# Patient Record
Sex: Male | Born: 1990 | Race: White
Health system: Southern US, Community
[De-identification: ages and names within clinical notes are randomized; demographics above are authoritative.]

---
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
## 2010-01-30 ENCOUNTER — Ambulatory Visit
Admit: 2010-01-30 | Disposition: A | Discharge status: Home / Self Care | Payer: Self-pay | Source: Ambulatory Visit | Admitting: Physical Medicine & Rehabilitation

## 2010-08-27 ENCOUNTER — Emergency Department: Payer: Self-pay | Admitting: Emergency Medicine

## 2010-10-07 IMAGING — CR FACIAL BONES - 1-2 VIEW
1 series · 4 of 4 positions shown · non-contrast
Comparison: none

REASON FOR EXAM: blunt object injury to left eye, need film of left
orbital area to rule out fx
COMMENTS:

PROCEDURE:     DXR - DXR FACIAL BONES LIMITED  - August 27, 2010  [DATE]
RESULT:     Facial bone images demonstrate slight difference in density
likely secondary to edema in the left periorbital region. A definite
fracture is not identified. The sinuses show grossly normal aeration.

[Series 1: view not recorded · 0.17mm/px · 4 of 4 slices shown]
[im 1/4]
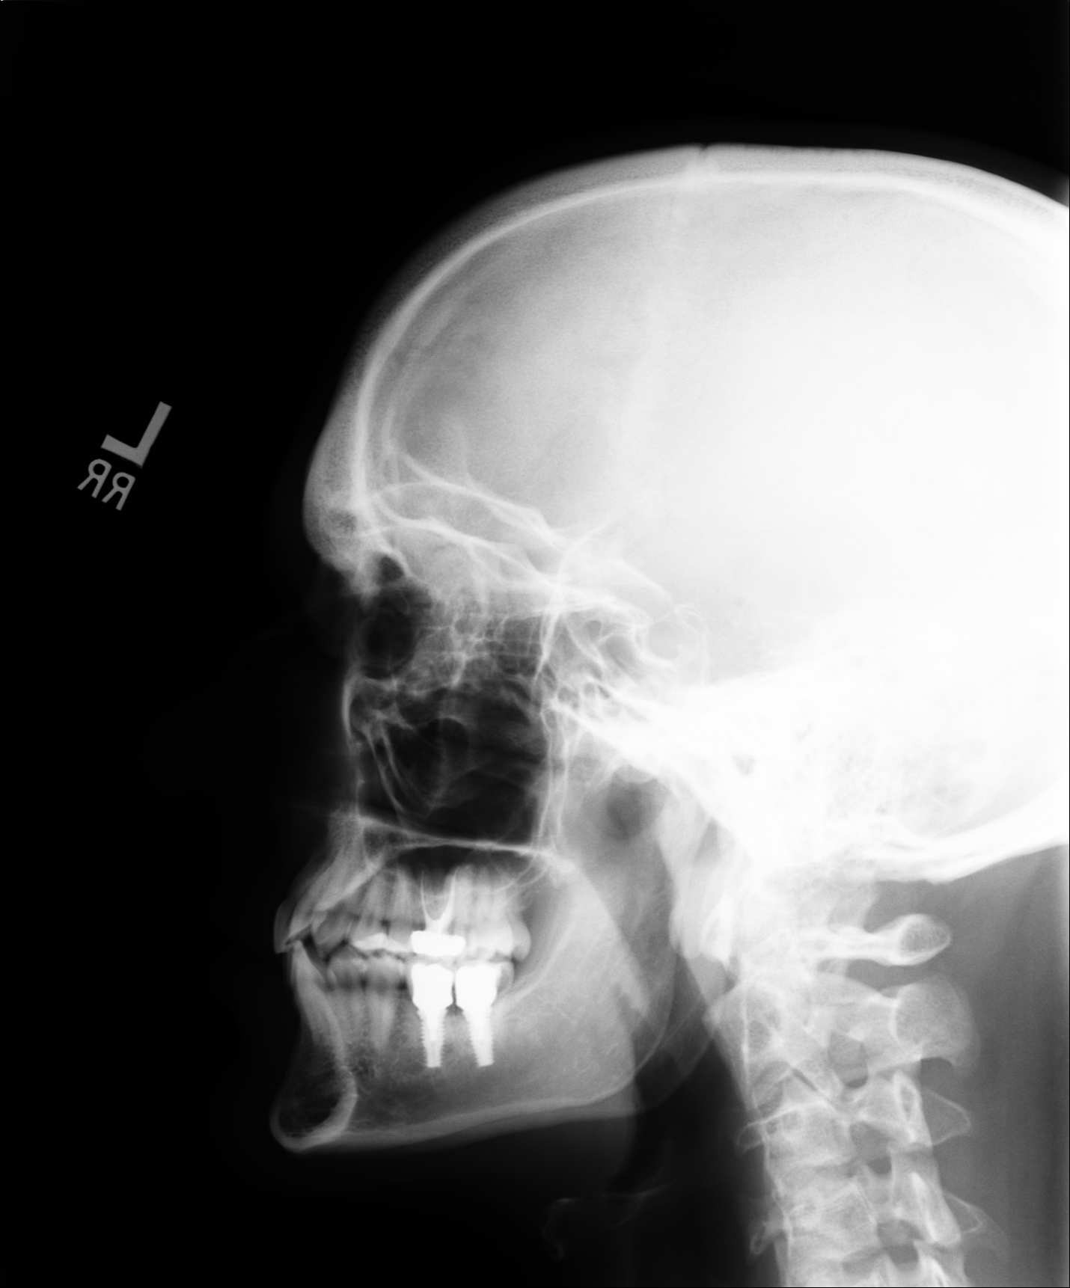
[im 2/4]
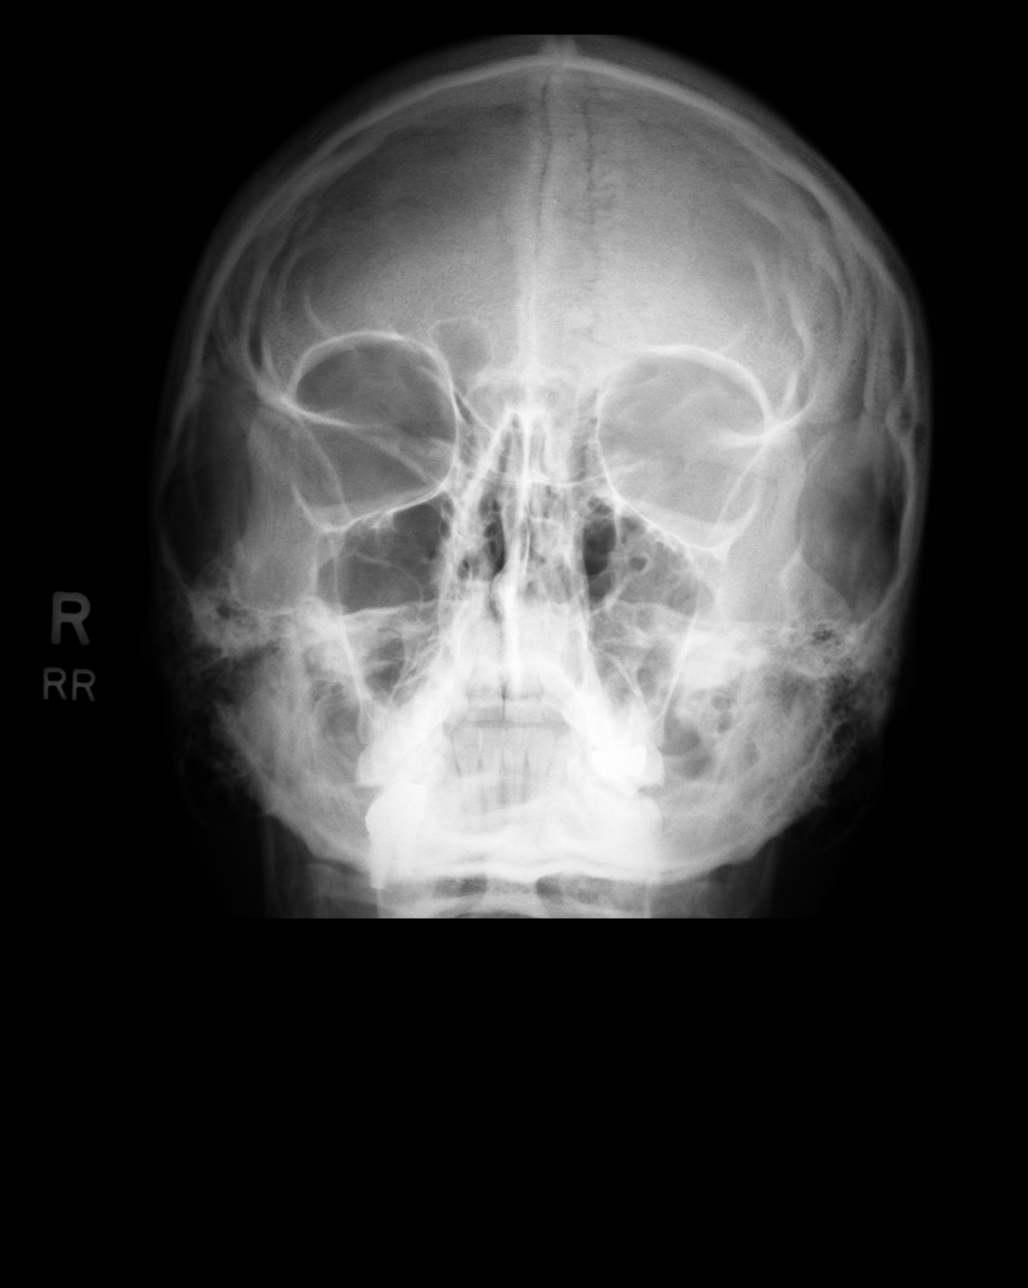
[im 3/4]
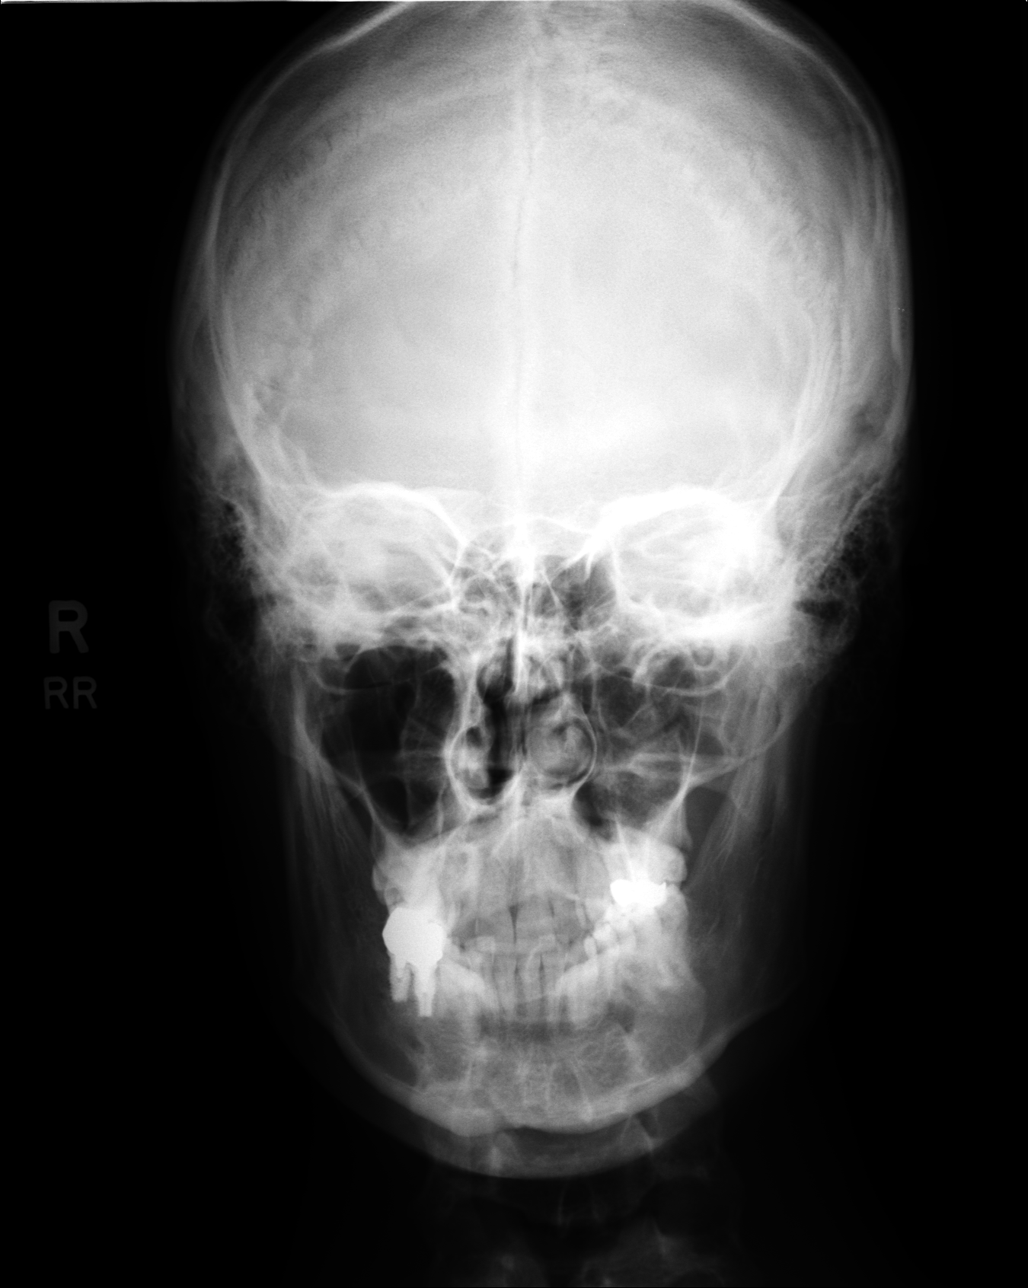
[im 4/4]
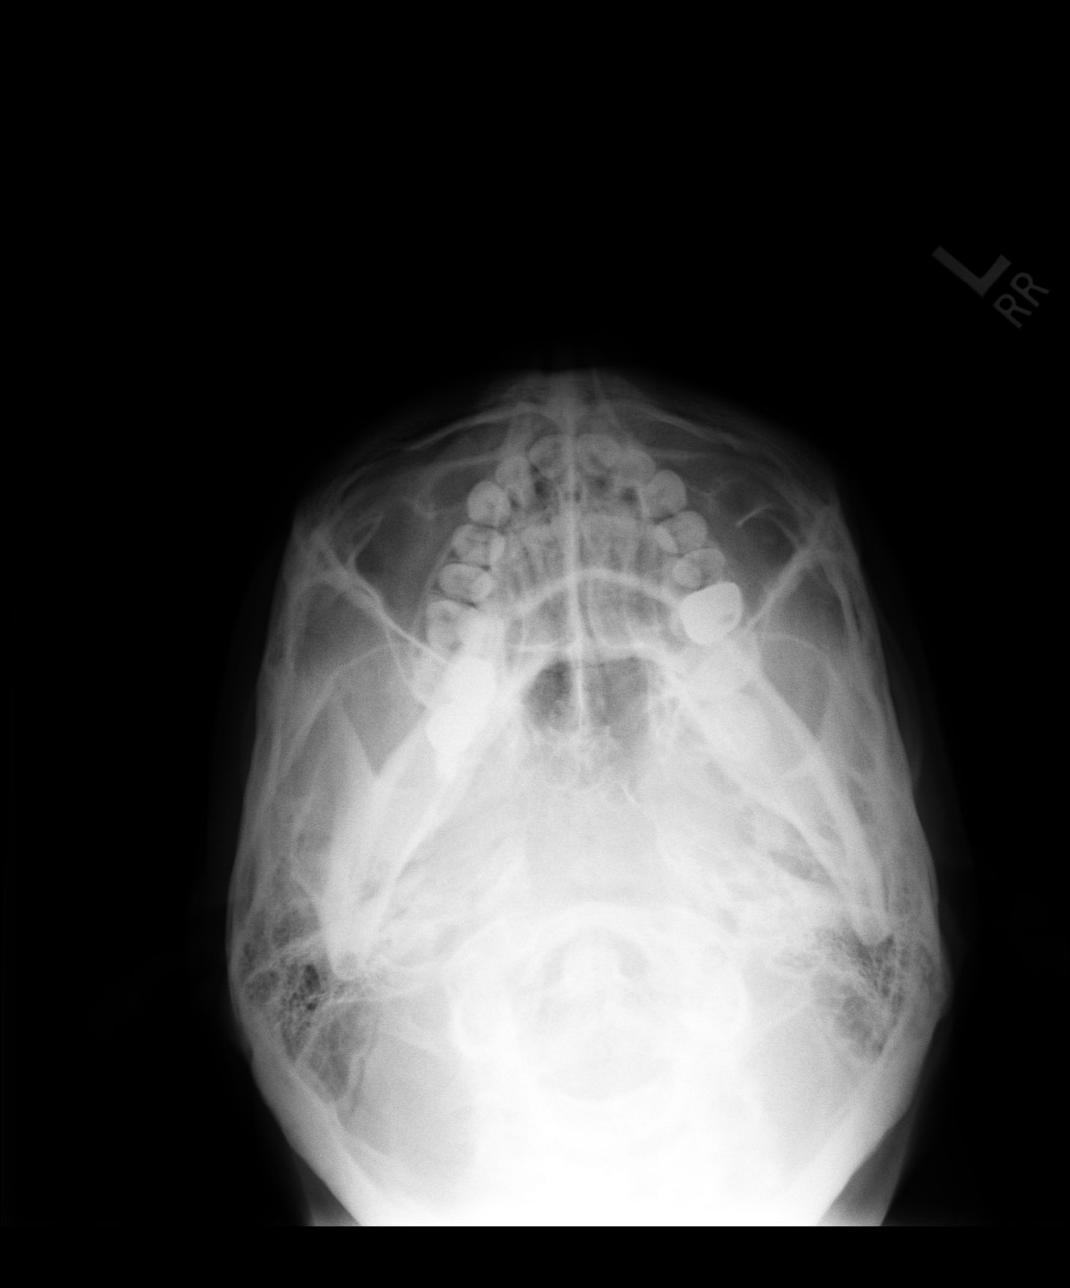

[4 of 4 positions shown; findings below may reference images not displayed]

IMPRESSION: No acute bony abnormality appreciated. CT is available, if
desired.

## 2011-10-11 NOTE — Progress Notes (Signed)
DATE OF BIRTH:                        [birth year]            DATE OF SERVICE:                      09/03/2007            PHYSICIAN:                          Lucrezia Europe, MD            HISTORY OF PRESENT ILLNESS: The patient returns for examination of his      right upper extremity. He has no complaints.            PHYSICAL EXAMINATION: Shows full range of motion.            IMAGING STUDIES: Radiograph shows the fracture is completely healed.            ASSESSMENT AND PLAN: Patient can begin performing high level activities      including lacrosse. He will follow up with me p.r.n.                                                Electronic Signing MD: Lucrezia Europe, MD  ([id number])            D:     09/03/2007 by Lucrezia Europe, MD      T:     09/03/2007 by [id number] Shela Commons:*********) ([id number])      cc:

## 2011-10-11 NOTE — Progress Notes (Signed)
DATE OF BIRTH:                        1991/05/01            DATE OF SERVICE:                      08/06/2007            PHYSICIAN:                          Lucrezia Europe, MD            REASON FOR VISIT:  The patient is now 6 weeks status post operative      fixation of a right proximal humerus fracture.  He has no complaints.            PHYSICAL EXAMINATION:  Examination shows near complete and full range of      motion.  He has no pain with  passive range of motion.            IMAGING STUDIES:  Radiograph shows excellent alignment and early callus      formation.            ASSESSMENT AND PLAN:  The patient will continue working with his physical      therapist.  He will now incorporate strengthening.  He will follow up in 4      weeks for repeat clinical and radiographic examination.                                                Electronic Signing MD: Lucrezia Europe, MD  ([id number])            D:     08/06/2007 by Lucrezia Europe, MD      T:     08/06/2007 by [id number] ([id number]) ([id number])      cc:

## 2011-10-11 NOTE — Progress Notes (Signed)
DATE OF BIRTH:                        [birth year]            DATE OF SERVICE:                      07/02/2007            PHYSICIAN:                          Lucrezia Europe, MD            The patient is 2 weeks status post operative internal fixation of a right      proximal humerus fracture that he sustained while playing lacrosse.            We removed his staples.  His wound is clean and dry.  His shoulder range of      motion is limited to 90 degrees forward flexion, 80 degrees abduction.            Radiograph shows excellent alignment.            ASSESSMENT AND PLAN: The patient will work on his range of motion both      actively and passively.  Follow up in 4 weeks for repeat clinical and      radiographic examination.                                                Electronic Signing MD: Lucrezia Europe, MD  ([id number])            D:     07/02/2007 by Lucrezia Europe, MD      T:     07/02/2007 by [id number] ([id number]) ([id number])      cc:

## 2011-10-11 NOTE — Op Note (Signed)
DATE OF BIRTH:                        [birth year]      ADMISSION DATE:                     06/23/2007            PATIENT LOCATION:                    [facility] 06/25/2007            DATE OF PROCEDURE:                   06/24/2007      SURGEON:                            Lucrezia Europe, MD      ASSISTANT(S):                  ASSISTANT:  Cipriano Mile            PREOPERATIVE DIAGNOSIS:  RIGHT PROXIMAL HUMERUS FRACTURE, SALTER-HARRIS      TYPE 2.            POSTOPERATIVE DIAGNOSIS:  RIGHT PROXIMAL HUMERUS FRACTURE, SALTER-HARRIS      TYPE 2.            PROCEDURE:  OPEN TREATMENT AND INTERNAL FIXATION OF RIGHT PROXIMAL HUMERUS      FRACTURE.            ANESTHESIA:  General.            COMPLICATIONS:  None.            ESTIMATED BLOOD LOSS:  Minimal.            INDICATIONS:  The patient is a 20 year old young man who sustained a      fracture to his right proximal humerus growth plate while playing Lacrosse.      The fracture was displaced 100%.  It was discussed with the patient and his      parents the indications for operative fixation and the risks, benefits, and      alternatives to operative treatment, versus closed treatment versus closed      reduction with percutaneous pinning.  They understood these and agreed to      proceed.            DESCRIPTION OF PROCEDURE:  After anesthesia was achieved, the right upper      extremity was prepped and draped in a standard sterile surgical fashion.            A deltopectoral approach was performed.  The incision was taken down      through skin and subcutaneous tissue.  The cephalic vein was mobilized with      the deltoid and the deltopectoral interval was entered.  Dissection was      taken down to the shaft of the humerus and the deltoid muscle was retracted      laterally.  The deltoid muscle was elevated distally.  The subscapularis      and pectoralis tendons were not taken down.            With traction and a levering maneuver, a reduction was performed.      Periosteum was  dissected on the proximal side of the fracture. On the  distal side, auto-dissection was performed.  The fracture was fine-tuned      and reduced anatomically which was confirmed under direct visualization and      fluoroscopic visualization.            Percutaneous 2.0 K-wires were placed in a retrograde fashion giving      temporary fixation.  An anterolateral locking plate was then placed.      Locking screws were placed into the metaphysis and head.  Nonlocking screws      were placed distally.  The percutaneous wires were removed and the limb was      taken through a full range of motion.  Multiple radiographs were obtained      in multiple planes demonstrating that all screws were safe.  The wounds      were then thoroughly irrigated.  The guide from the plate was removed.  The      clavipectoral fascia was closed with a 0 Prolene suture.  Subcutaneous      layer was closed with 2-0 Prolene and the skin was stapled.  Sterile      dressings were placed.  The patient was extubated and taken to the post      anesthesia care unit.                                          Electronic Signing MD: Lucrezia Europe, MD  ([id number])            D: 06/25/2007 by Lucrezia Europe, MD      T: 06/25/2007 by [email or web address] ([id number]) ([id number])      cc:  Lucrezia Europe, MD

## 2012-03-18 ENCOUNTER — Encounter (INDEPENDENT_AMBULATORY_CARE_PROVIDER_SITE_OTHER): Payer: Self-pay

## 2012-03-18 ENCOUNTER — Ambulatory Visit (INDEPENDENT_AMBULATORY_CARE_PROVIDER_SITE_OTHER): Payer: BC Managed Care – PPO | Admitting: Adult Health

## 2012-03-18 DIAGNOSIS — A64 Unspecified sexually transmitted disease: Secondary | ICD-10-CM

## 2012-03-20 NOTE — Progress Notes (Signed)
Left w/o being seen

## 2015-11-14 ENCOUNTER — Ambulatory Visit (INDEPENDENT_AMBULATORY_CARE_PROVIDER_SITE_OTHER): Payer: BLUE CROSS/BLUE SHIELD | Admitting: Sports Medicine

## 2015-11-14 ENCOUNTER — Other Ambulatory Visit: Payer: Self-pay | Admitting: Sports Medicine

## 2015-11-14 ENCOUNTER — Encounter: Payer: Self-pay | Admitting: Sports Medicine

## 2015-11-14 VITALS — BP 99/57 | HR 42 | Ht 75.0 in | Wt 190.0 lb

## 2015-11-14 DIAGNOSIS — M5442 Lumbago with sciatica, left side: Secondary | ICD-10-CM

## 2015-11-14 NOTE — Progress Notes (Signed)
   Subjective:    Patient ID: Justin Burns, male    DOB: Apr 25, 1991, 24 y.o.   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 Patient has had problems with his low back all the way back into childhood. He has had x-rays as well as a bone scan all of which are normal per his report. Over the summer he took a long road trip and did quite a bit of hiking and running and he began to experience some intermittent radiating pain down his left leg into the lateral aspect of his left lower leg. He has associated numbness and tingling with this as well. He has not noticed any weakness. He has been working in physical therapy for the past several weeks and has noticed some improvement with piriformis stretching but not complete symptom resolution. He denies any groin pain. His symptoms are most pronounced with prolonged driving or sitting. He does also get pain with prolonged standing. Significant discomfort with running as well. He has tried over-the-counter pain medications without any relief. No prior low back surgery. No change in bowel or bladder. He is here today at the request of his physical therapist for further evaluation and treatment.  Past medical history reviewed Medications reviewed Allergies reviewed    Review of Systems As above    Objective:   Physical Exam Well-developed, well-nourished. No acute distress. Awake alert and oriented 3. Vital signs reviewed.  Lumbar spine: Good lumbar range of motion. No tenderness to palpation along the lumbar midline. No spasm. Negative straight leg raise bilaterally. Strength is 5/5 both lower extremities. Reflexes are brisk and equal the Achilles and patellar tendons. Sensation is intact to light touch grossly. No obvious atrophy. Walking without a limp.       Assessment & Plan:  Chronic low back pain-rule out lumbar disc herniation versus spinal  stenosis  Patient's symptoms have been present intermittently for several years. He has had normal x-rays in the past. I think we need to completely evaluate this with an MRI specifically to rule out spinal stenosis or lumbar disc herniation. Patient will follow-up with me after that study to go over the results and delineate further treatment. He is instructed to bring his running shoes with him to his follow-up visit so that I can do a running analysis. I would also like to check some blood work as he is also complaining of some diffuse arthralgias. I will check a CBC, CMP, sedimentation rate, C-reactive protein, ANA, and rheumatoid factor.

## 2015-11-25 ENCOUNTER — Ambulatory Visit
Admission: RE | Admit: 2015-11-25 | Discharge: 2015-11-25 | Disposition: A | Discharge status: Home / Self Care | Payer: BLUE CROSS/BLUE SHIELD | Source: Ambulatory Visit | Attending: Sports Medicine | Admitting: Sports Medicine

## 2015-11-25 DIAGNOSIS — M5442 Lumbago with sciatica, left side: Secondary | ICD-10-CM

## 2015-11-30 ENCOUNTER — Ambulatory Visit (INDEPENDENT_AMBULATORY_CARE_PROVIDER_SITE_OTHER): Payer: BLUE CROSS/BLUE SHIELD | Admitting: Sports Medicine

## 2015-11-30 ENCOUNTER — Encounter: Payer: Self-pay | Admitting: Sports Medicine

## 2015-11-30 VITALS — BP 110/70 | Ht 75.0 in | Wt 190.0 lb

## 2015-11-30 DIAGNOSIS — M5442 Lumbago with sciatica, left side: Secondary | ICD-10-CM

## 2015-11-30 NOTE — Progress Notes (Signed)
   Subjective:    Patient ID: Justin Burns, male    DOB: 06-23-1991, 24 y.o.   MRN: [id number]  HPI   Patient comes in today for follow-up. MRI of his lumbar spine shows only a very minimal disc bulge at L4-L5 L5-S1. This is in stark contrast to what a chiropractor recently told him. He has not been doing any running over the past few weeks and as a result his symptoms have improved. He did not get the blood work done that was ordered during his last office visit. He also failed to bring his running shoes with him today.    Review of Systems     Objective:   Physical Exam Well-developed, well-nourished. No acute distress  Good lumbar range of motion. No gross focal neurological deficits of either lower extremity  MRI is as above       Assessment & Plan:  Chronic low back pain likely biomechanical in nature  Reassurance regarding his MRI. I still think that his symptoms are biomechanical and I would like to do a running analysis. Unfortunately he forgot to bring his running shoes with him. He will call for a follow-up appointment sometime early next year and we will plan on doing a gait analysis and running analysis at that time. We will also reorder his blood work at follow-up as well. In the meantime, I think he may continue with activity as tolerated.

## 2015-12-15 DIAGNOSIS — F419 Anxiety disorder, unspecified: Secondary | ICD-10-CM | POA: Insufficient documentation

## 2015-12-28 ENCOUNTER — Telehealth: Payer: Self-pay | Admitting: Sports Medicine

## 2015-12-28 NOTE — Telephone Encounter (Signed)
I spoke with the patient on the phone today about his ongoing low back pain. Since seeing me, he has sought a second opinion. He was referred to [facility] for physical therapy and he called me to see if I agree with this plan of care. I reassured him that I think this is the best plan of care for his low back pain. His MRI is fairly unremarkable. He certainly does not have any surgical pathology. I've encouraged him to continue to be active and I've reassured him that he is not going to do any structural damage to his spine by doing so. I will fax over a referral to [facility] and [facility] and he will wean to a home exercise program per their discretion. I think it would be beneficial for him to also follow-up with me at some point in the near future for a gait analysis to see if there is something that we may need to do within orthotic.

## 2015-12-28 NOTE — Telephone Encounter (Signed)
-----   Message from Claiborne BillingsMartha J Delaney sent at 12/27/2015  4:46 PM EST ----- Contact: [phone] Would like a call regarding / follow-up regarding his back pain - received a second opinion.  Had scheduled PT and needs referral and wants to make sure that you are okay with that.

## 2016-01-05 IMAGING — CR DG ORBITS FOR FOREIGN BODY
2 series · 2 of 2 positions shown · non-contrast
Comparison: Facial radiographs 08/27/2010.

CLINICAL DATA: Metal working/exposure; clearance prior to MRI
24-year-old male. Initial encounter.

EXAM:
ORBITS FOR FOREIGN BODY - 2 VIEW

[w orbit pa (1 of 2)]
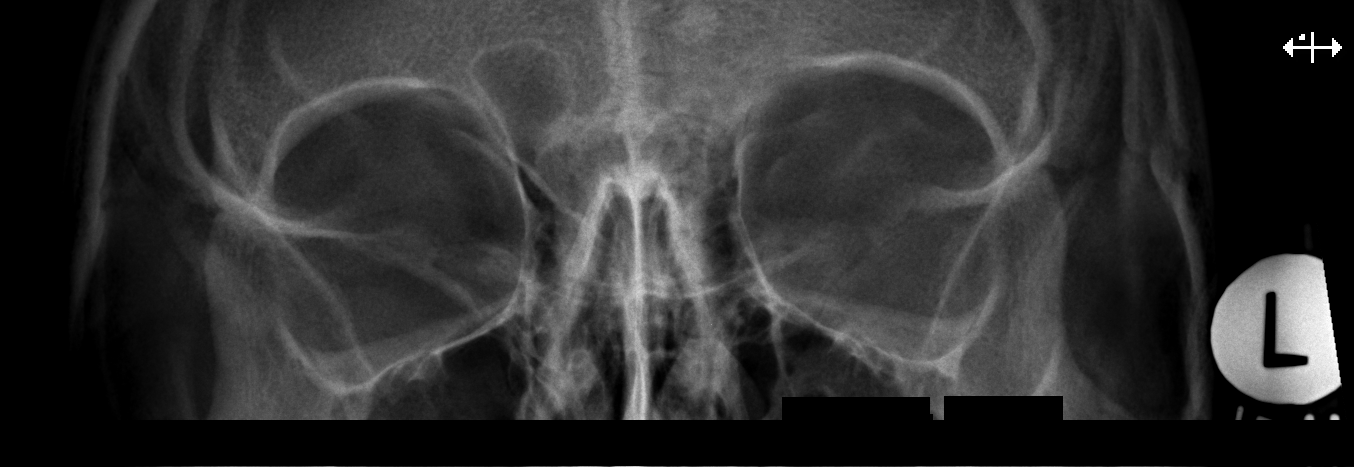

[w orbit pa (2 of 2)]
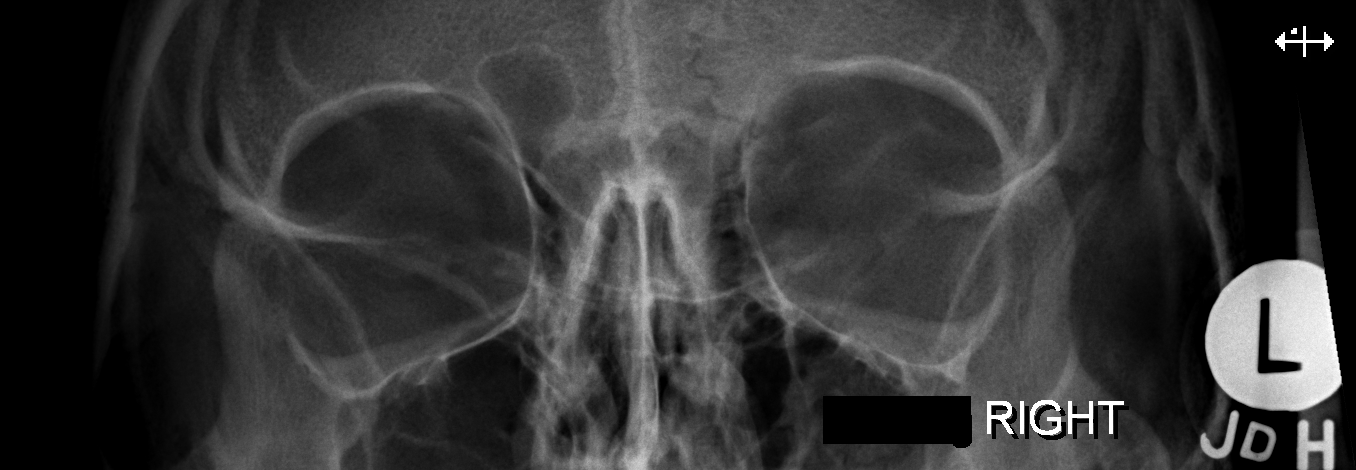

[2 of 2 positions shown; findings below may reference images not displayed]

FINDINGS: There is no evidence of metallic foreign body within the orbits. No
significant bone abnormality identified.
IMPRESSION: No evidence of metallic foreign body within the orbits.

## 2016-01-31 ENCOUNTER — Encounter: Payer: Self-pay | Admitting: Sports Medicine

## 2016-01-31 ENCOUNTER — Ambulatory Visit (INDEPENDENT_AMBULATORY_CARE_PROVIDER_SITE_OTHER): Payer: BLUE CROSS/BLUE SHIELD | Admitting: Sports Medicine

## 2016-01-31 VITALS — BP 102/58 | Ht 75.0 in | Wt 190.0 lb

## 2016-01-31 DIAGNOSIS — M545 Low back pain, unspecified: Secondary | ICD-10-CM

## 2016-01-31 NOTE — Progress Notes (Signed)
   Subjective:    Patient ID: Justin Burns, male    DOB: 1991-10-24, 25 y.o.   MRN: [id number]  HPI  Patient comes in today with persistent low back pain. An MRI showed some minimal disc bulging at L4-L5 and L5-S1 but nothing severe. He has had 9 visits to physical therapy and although his symptoms do intermittently improve he still has setbacks. His low back pain was primarily radiating into his left leg prior to getting two epidural injections. His pain now seems to be centered more to the right side of his low back. He does note that if he does a self SI joint manipulation his symptoms seem to improve. He is questioning whether or not his mattress or his vehicle may also be responsible for some of his pain. He is concerned because he may be leaving in a few months to take a job in [place] and will have to ride across the country which may exacerbate his symptoms.    Review of Systems     Objective:   Physical Exam Well developed, well-nourished. No acute distress. Somewhat anxious appearing.  Lumbar spine: Good lumbar mobility. No spasm. Neurological exam: No focal neurological deficit of either lower extremity Feet: Will maintain longitudinal arch. Some collapse of the transverse arches bilaterally.   gait: Pronation with walking       Assessment & Plan:  Persistent chronic mechanical low back pain  MRI does not show operative pathology. Since he has failed to improve significantly with physical therapy I would like to change course just a bit. I recommended a referral to Dr. Gaspar Bidding for osteopathic manipulation. I will also give him some green sports insoles and scaphoid pads for his pronation. I think the patient should wait on a third epidural steroid injection until closer to his move to [place]. In fact, I'm not overly optimistic that repeat epidural injections are going to help anyway. I wonder if anxiety is playing a role in some of his symptoms. He does admit to  getting anxious from time to time. Patient will follow-up with me in 4 weeks for reevaluation.

## 2016-02-07 ENCOUNTER — Ambulatory Visit: Payer: BLUE CROSS/BLUE SHIELD | Admitting: Sports Medicine

## 2016-02-28 ENCOUNTER — Ambulatory Visit: Payer: BLUE CROSS/BLUE SHIELD | Admitting: Sports Medicine

## 2016-03-26 ENCOUNTER — Ambulatory Visit: Payer: BLUE CROSS/BLUE SHIELD | Admitting: Sports Medicine

## 2017-08-13 ENCOUNTER — Other Ambulatory Visit (INDEPENDENT_AMBULATORY_CARE_PROVIDER_SITE_OTHER): Payer: Self-pay | Admitting: Family Medicine

## 2017-08-21 ENCOUNTER — Other Ambulatory Visit (INDEPENDENT_AMBULATORY_CARE_PROVIDER_SITE_OTHER): Payer: Self-pay | Admitting: Family

## 2017-08-22 ENCOUNTER — Other Ambulatory Visit (INDEPENDENT_AMBULATORY_CARE_PROVIDER_SITE_OTHER): Payer: Self-pay | Admitting: Psychiatric - Mental Health Nurse Practitioner (Across the Lifespan)

## 2017-08-27 ENCOUNTER — Other Ambulatory Visit (INDEPENDENT_AMBULATORY_CARE_PROVIDER_SITE_OTHER): Payer: Self-pay | Admitting: Psychiatric - Mental Health Nurse Practitioner (Across the Lifespan)

## 2017-09-09 ENCOUNTER — Other Ambulatory Visit (INDEPENDENT_AMBULATORY_CARE_PROVIDER_SITE_OTHER): Payer: Self-pay | Admitting: Physician Assistant

## 2017-09-17 ENCOUNTER — Other Ambulatory Visit (INDEPENDENT_AMBULATORY_CARE_PROVIDER_SITE_OTHER): Payer: Self-pay | Admitting: Psychiatric - Mental Health Nurse Practitioner (Across the Lifespan)

## 2017-09-19 ENCOUNTER — Other Ambulatory Visit (INDEPENDENT_AMBULATORY_CARE_PROVIDER_SITE_OTHER): Payer: Self-pay | Admitting: Family Medicine

## 2017-09-24 ENCOUNTER — Other Ambulatory Visit (INDEPENDENT_AMBULATORY_CARE_PROVIDER_SITE_OTHER): Payer: Self-pay | Admitting: Family Medicine

## 2017-09-24 DIAGNOSIS — F329 Major depressive disorder, single episode, unspecified: Secondary | ICD-10-CM | POA: Insufficient documentation

## 2017-10-09 ENCOUNTER — Other Ambulatory Visit (INDEPENDENT_AMBULATORY_CARE_PROVIDER_SITE_OTHER): Payer: Self-pay | Admitting: Family

## 2018-02-28 ENCOUNTER — Other Ambulatory Visit: Payer: Self-pay | Admitting: Internal Medicine

## 2019-06-10 LAB — CBC AND DIFFERENTIAL
Baso(Absolute): 0 10*3/uL (ref 0.0–0.2)
Baso(Absolute): 0.1 10*3/uL (ref 0.0–0.2)
Basos: 1 %
Basos: 1 %
Eos: 3 %
Eos: 6 %
Eosinophils Absolute: 0.2 10*3/uL (ref 0.0–0.4)
Eosinophils Absolute: 0.3 10*3/uL (ref 0.0–0.4)
Hematocrit: 45.2 % (ref 37.5–51.0)
Hematocrit: 43.2 % (ref 37.5–51.0)
Hemoglobin: 15.7 g/dL (ref 13.0–17.7)
Hemoglobin: 14.9 g/dL (ref 13.0–17.7)
Immature Granulocytes Absolute: 0 10*3/uL (ref 0.0–0.1)
Immature Granulocytes Absolute: 0 10*3/uL (ref 0.0–0.1)
Immature Granulocytes: 0 %
Immature Granulocytes: 0 %
Lymphocytes Absolute: 2 10*3/uL (ref 0.7–3.1)
Lymphocytes Absolute: 1.8 10*3/uL (ref 0.7–3.1)
Lymphocytes: 30 %
Lymphocytes: 31 %
MCH: 29.1 pg (ref 26.6–33.0)
MCH: 29.2 pg (ref 26.6–33.0)
MCHC: 34.7 g/dL (ref 31.5–35.7)
MCHC: 34.5 g/dL (ref 31.5–35.7)
MCV: 84 fL (ref 79–97)
MCV: 85 fL (ref 79–97)
Monocytes Absolute: 0.4 10*3/uL (ref 0.1–0.9)
Monocytes Absolute: 0.4 10*3/uL (ref 0.1–0.9)
Monocytes: 6 %
Monocytes: 7 %
Neutrophils Absolute: 4 10*3/uL (ref 1.4–7.0)
Neutrophils Absolute: 3.2 10*3/uL (ref 1.4–7.0)
Neutrophils: 60 %
Neutrophils: 55 %
Platelets: 240 10*3/uL (ref 150–379)
Platelets: 209 10*3/uL (ref 150–379)
RBC: 5.4 x10E6/uL (ref 4.14–5.80)
RBC: 5.11 x10E6/uL (ref 4.14–5.80)
RDW: 13 % (ref 12.3–15.4)
RDW: 13.1 % (ref 12.3–15.4)
WBC: 6.7 10*3/uL (ref 3.4–10.8)
WBC: 5.7 10*3/uL (ref 3.4–10.8)

## 2019-06-10 LAB — COMPREHENSIVE METABOLIC PANEL
ALT: 14 IU/L (ref 0–44)
AST (SGOT): 18 IU/L (ref 0–40)
Albumin/Globulin Ratio: 2 (ref 1.2–2.2)
Albumin: 4.5 g/dL (ref 3.5–5.5)
Alkaline Phosphatase: 44 IU/L (ref 39–117)
BUN / Creatinine Ratio: 12 (ref 9–20)
BUN: 11 mg/dL (ref 6–20)
Bilirubin, Total: 0.6 mg/dL (ref 0.0–1.2)
CO2: 25 mmol/L (ref 20–29)
Calcium: 9.5 mg/dL (ref 8.7–10.2)
Chloride: 103 mmol/L (ref 96–106)
Creatinine: 0.91 mg/dL (ref 0.76–1.27)
EGFR: 116 mL/min/{1.73_m2} (ref >59)
EGFR: 134 mL/min/{1.73_m2} (ref >59)
Globulin, Total: 2.2 g/dL (ref 1.5–4.5)
Glucose: 86 mg/dL (ref 65–99)
Potassium: 4.8 mmol/L (ref 3.5–5.2)
Protein, Total: 6.7 g/dL (ref 6.0–8.5)
Sodium: 142 mmol/L (ref 134–144)

## 2019-06-10 LAB — LYME AB, TOTAL,REFLEX TO WESTERN BLOT (IGG & IGM)
Lyme Disease Ab, Quant, IgM: <0.8 index (ref 0.00–0.79)
Lyme IgG/IgM Ab: <0.91 {ISR} (ref 0.00–0.90)

## 2019-06-10 LAB — URINE CULTURE
Result 1:: NO GROWTH
Result 1:: NO GROWTH

## 2019-06-10 LAB — UPPER RESPIRATORY CULTURE

## 2019-06-10 LAB — RHEUMATOID FACTOR: RA Latex Turbid.: <10 IU/mL (ref 0.0–13.9)

## 2019-06-10 LAB — CRP+ASO
ASO: 167 IU/mL (ref 0.0–200.0)
C-Reactive Protein: 1.2 mg/L (ref 0.0–4.9)

## 2019-06-10 LAB — EPSTEIN-BARR VIRUS VCA ANTIBODY PANEL
EBV VCA Ab, IgG: 230 U/mL — ABNORMAL HIGH (ref 0.0–17.9)
EBV VCA Ab, IgM: <36 U/mL (ref 0.0–35.9)
Epstein-Barr Virus Early Antigen, IgG: <9 U/mL (ref 0.0–8.9)
Epstein-Barr virus, Nuclear AG AB: 387 U/mL — ABNORMAL HIGH (ref 0.0–17.9)

## 2019-06-10 LAB — CT/GC NAA, PHARYNGEAL
C. trachomatis, NAA, Pharyn: NEGATIVE
N. gonorrhoeae, NAA, Pharyn: NEGATIVE

## 2019-06-10 LAB — CHLAMYDIA GONORRHOEAE NAA
CHLAMYDIA TRACHOMATIS, NAA: NEGATIVE
CHLAMYDIA TRACHOMATIS, NAA: NEGATIVE
Neisseria gonorrhoeae, NAA: NEGATIVE
Neisseria gonorrhoeae, NAA: NEGATIVE

## 2019-06-10 LAB — MYCOPLASMA / UREAPLASMA CULTURE
Mycoplasma Hominis Culture: NEGATIVE
Ureaplasma/Mycoplasma Culture: NEGATIVE

## 2019-06-10 LAB — CULTURE, AEROBIC, GENITAL

## 2019-06-10 LAB — VIRAL CULTURE,RAPID,LESION (HSV & VZV)

## 2019-06-10 LAB — HIV-1/2 AG/AB 4TH GEN. W/ REFLEX: HIV Screen 4th Generation wRfx: NONREACTIVE

## 2019-06-10 LAB — TSH: TSH: 0.532 u[IU]/mL (ref 0.450–4.500)

## 2019-06-10 LAB — SEDIMENTATION RATE: Sed Rate: 2 mm/hr (ref 0–15)

## 2019-06-10 LAB — RPR, RFX QN RPR/CONFIRM TP: RPR: NONREACTIVE

## 2019-06-10 LAB — HEPATITIS C ANTIBODY: HCV AB: 0.1 s/co ratio (ref 0.0–0.9)

## 2019-06-10 LAB — ANA W/REFLEX TO MULTIPLE CONFIRMATORY TESTS IF POSITIVE: ANA Direct: NEGATIVE

## 2020-09-02 ENCOUNTER — Ambulatory Visit (INDEPENDENT_AMBULATORY_CARE_PROVIDER_SITE_OTHER): Payer: BC Managed Care – PPO | Admitting: Family Medicine

## 2020-09-02 ENCOUNTER — Encounter (INDEPENDENT_AMBULATORY_CARE_PROVIDER_SITE_OTHER): Payer: Self-pay | Admitting: Family Medicine

## 2020-09-02 VITALS — BP 120/72 | HR 60 | Temp 98.2°F | Resp 14 | Ht 75.0 in | Wt 196.4 lb

## 2020-09-02 DIAGNOSIS — N419 Inflammatory disease of prostate, unspecified: Secondary | ICD-10-CM

## 2020-09-02 DIAGNOSIS — Z7251 High risk heterosexual behavior: Secondary | ICD-10-CM

## 2020-09-02 DIAGNOSIS — R109 Unspecified abdominal pain: Secondary | ICD-10-CM

## 2020-09-02 LAB — POCT URINALYSIS AUTOMATED (IAH)
Bilirubin, UA POCT: NEGATIVE
Blood, UA POCT: NEGATIVE
Glucose, UA POCT: NEGATIVE
Ketones, UA POCT: NEGATIVE mg/dL
Nitrite, UA POCT: NEGATIVE
PH, UA POCT: 6 (ref 4.6–8)
Protein, UA POCT: NEGATIVE mg/dL
Specific Gravity, UA POCT: >=1.03 mg/dL (ref 1.001–1.035)
Urine Leukocytes POCT: NEGATIVE
Urobilinogen, UA POCT: 0.2 mg/dL

## 2020-09-02 NOTE — Progress Notes (Signed)
[facility]                       Date of Exam: 09/02/2020 9:28 AM        Patient ID: Michael Henry is a 29 y.o. male.  Attending Physician: Reynold Bowen, MD        Chief Complaint:    Chief Complaint   Patient presents with   . Prostate Check     was dx 2 wks ago with infection, just completed 2 wks of ciprofloxacin                HPI:    Pt presents for follow up from prostate infection. States that on 6/31 he was seen at [facility] for penile discharge and pain at the penile head; GC/ CT neg and STD screen neg. Was treated with Rocephin IM, Doxy, and metronidazole. States that discharge resolved. Pain improved, but not resolved. On 8/13 he was camping outside and states that the amenities for the restroom were extremely unclean.     About 2 weeks ago, developed BLE weakness, genital discomfort, testicular pain, bilateral lower abdominal discomfort. Was seen by doctors in [place] and diagnosed with prostatitis; was treated with cipro x 2 weeks with resolution in acute symptoms. Last dose of Cipro was this morning. Was taking probiotics at that time also. STD recheck and UCx neg.     Now mostly endorses some GI discomfort; denies abdominal pain. Denies fevers, chills, diaphoresis, nausea, vomiting. Denies constipation, diarrhea, or bloody stool. Denies dysuria, hematuria, urgency, frequency, penile discharge/ lesions.     Last had intercourse about 1 month ago with male partner. In 05/2020 with 2 different male partners. Does not use condoms.                Problem List:    Patient Active Problem List   Diagnosis   . Verruca vulgaris   . Major depressive disorder   . Anxiety   . Acne   . High risk heterosexual behavior             Current Meds:    No outpatient medications have been marked as taking for the 09/02/20 encounter (Office Visit) with Reynold Bowen, MD.          Allergies:    Allergies   Allergen Reactions   . Levonorgestrel-Ethinyl Estrad Tinitus             Past Surgical  History:    History reviewed. No pertinent surgical history.        Family History:    History reviewed. No pertinent family history.        Social History:    Social History     Tobacco Use   . Smoking status: Current Some Day Smoker     Years: 3.00     Types: Cigarettes   . Smokeless tobacco: Former [employer]   . Vaping Use: Never used   Substance Use Topics   . Alcohol use: Yes     Alcohol/week: 8.0 standard drinks     Types: 8 Glasses of wine per week   . Drug use: Yes     Types: Marijuana          The following sections were reviewed this encounter by the provider:   Tobacco  Allergies  Meds  Problems  Med Hx  Surg Hx  Fam Hx  Vital Signs:    BP 120/72 (BP Site: Left arm, Patient Position: Sitting, Cuff Size: Medium)   Pulse 60   Temp 98.2 F (36.8 C) (Tympanic)   Resp 14   Ht 1.905 m (6\' 3" )   Wt 89.1 kg (196 lb 6.4 oz)   BMI 24.55 kg/m          ROS:     ROS per HPI; all other systems reviewed and are negative            Physical Exam:    Physical Exam  Vitals reviewed.   Constitutional:       General: He is not in acute distress.     Appearance: Normal appearance. He is normal weight. He is not ill-appearing, toxic-appearing or diaphoretic.   HENT:      Head: Normocephalic.   Eyes:      Conjunctiva/sclera: Conjunctivae normal.   Pulmonary:      Effort: Pulmonary effort is normal. No respiratory distress.   Neurological:      Mental Status: He is alert and oriented to person, place, and time. Mental status is at baseline.   Psychiatric:         Mood and Affect: Mood normal.         Behavior: Behavior normal.         Thought Content: Thought content normal.         Judgment: Judgment normal.              Assessment/ plan:    1. Prostatitis, unspecified prostatitis type  - POCT UA Clinitek AX (urine dipstick)  - Urine culture    2. Abdominal discomfort    3. High risk heterosexual behavior    Discussed GI symptoms likely from antibiotic course. Recommended to continue probiotics  and light bland foods. Discussed symptoms to monitor for return of GU symptoms or to monitor for cdiff.   Discussed his high risk sexual behavior; recommended to use condoms to reduce the risk of recurrence and STD transmission.     Time spent >50% in FTF consultation and coordination of care: 30 min               Follow-up:    Return if symptoms worsen or fail to improve.         Reynold Bowen, MD

## 2020-09-04 LAB — URINE CULTURE: Result 1:: NO GROWTH

## 2020-09-05 ENCOUNTER — Telehealth (INDEPENDENT_AMBULATORY_CARE_PROVIDER_SITE_OTHER): Payer: Self-pay | Admitting: Family Medicine

## 2020-09-05 NOTE — Telephone Encounter (Addendum)
Both numbers on pt file is not in service.  ----- Message from Reynold Bowen, MD sent at 09/05/2020 11:09 AM EDT -----  Please call pt with results. Thanks      Urine normal and does not show infection

## 2020-09-06 NOTE — Telephone Encounter (Signed)
2nd attempt. Dialed all numbers on pts chart.  home number [phone] - not in service  mobile number [phone] no longer in service  mom's number home number [phone] dial tone not ringing through

## 2020-09-06 NOTE — Progress Notes (Signed)
Several attempts made to reach pt. None successful.

## 2020-09-20 DIAGNOSIS — R102 Pelvic and perineal pain: Secondary | ICD-10-CM | POA: Insufficient documentation

## 2021-01-26 ENCOUNTER — Encounter (INDEPENDENT_AMBULATORY_CARE_PROVIDER_SITE_OTHER): Payer: Self-pay | Admitting: Family Medicine

## 2021-01-26 ENCOUNTER — Telehealth (INDEPENDENT_AMBULATORY_CARE_PROVIDER_SITE_OTHER): Payer: BC Managed Care – PPO | Admitting: Family Medicine

## 2021-01-26 DIAGNOSIS — R0981 Nasal congestion: Secondary | ICD-10-CM

## 2021-01-26 DIAGNOSIS — H6980 Other specified disorders of Eustachian tube, unspecified ear: Secondary | ICD-10-CM

## 2021-01-26 DIAGNOSIS — Z113 Encounter for screening for infections with a predominantly sexual mode of transmission: Secondary | ICD-10-CM

## 2021-01-26 DIAGNOSIS — J0100 Acute maxillary sinusitis, unspecified: Secondary | ICD-10-CM

## 2021-01-26 DIAGNOSIS — Z789 Other specified health status: Secondary | ICD-10-CM

## 2021-01-26 DIAGNOSIS — J301 Allergic rhinitis due to pollen: Secondary | ICD-10-CM

## 2021-01-26 LAB — POCT URINALYSIS AUTOMATED (IAH)
Bilirubin, UA POCT: NEGATIVE
Blood, UA POCT: NEGATIVE
Glucose, UA POCT: NEGATIVE
Ketones, UA POCT: NEGATIVE mg/dL
Nitrite, UA POCT: NEGATIVE
PH, UA POCT: 7.5 (ref 4.6–8)
Protein, UA POCT: NEGATIVE mg/dL
Specific Gravity, UA POCT: 1.02 mg/dL (ref 1.001–1.035)
Urine Leukocytes POCT: NEGATIVE
Urobilinogen, UA POCT: 0.2 mg/dL

## 2021-01-26 MED ORDER — AMOXICILLIN-POT CLAVULANATE 875-125 MG PO TABS
1.0000 | ORAL_TABLET | Freq: Two times a day (BID) | ORAL | 0 refills | Status: AC
Start: 2021-01-26 — End: 2021-02-02

## 2021-01-26 NOTE — Progress Notes (Signed)
[facility]                       Date of Virtual Visit: 01/26/2021 2:52 PM        Patient ID: Michael Henry is a 30 y.o. male.  Attending Physician: Reynold Bowen, MD       Telemedicine Eligibility:      For your treatment today, do you prefer that we bill you directly or submit to insurance?    [x]  Submit claim to insurance  []  Bill patient directly    State Location:    [x]  [place]  []  [place]  []  [place] []  [place]    []  Other (COVID related only - specify location):    Patient Identity Verification:    []  State Issued ID  [x]  DOB / photo ID  []  Other (specify):           Chief Complaint:    Nasal Congestion (vomiting x 1 episode start of feeling sick on and off. ongoing with upset stomach & chills x 10 days )               HPI:      With presents for congestion. Symptoms started about 1.5 weeks ago after he flew back from [place]. Endorsed nausea, fatigue, sinus pressure, subjective fever, chills, diaphoresis. Last subjective fever yesterday. Denies cough, vomiting, or diarrhea. Having irregular BMs and trying probiotics. Doing nasal saline rinses. Will be flying back to [place] next week. Endorses seasonal allergies; not taking any medications regularly for this. States that he has eustachian tube dysfunction and previously was taking sudafed-DM for this.     Last seen for OV 08/2020 for UC follow up for prostatitis. Was treated for presumed STDs after urinary concerns, but STD testing was all negative. Urine testing at office was negative. Pt followed up with urology, who stated that he did not have prostatitis based on his symptoms. Pt now wonders if his previous urinary symptoms were related to taking Sudafed, as symptoms started shortly after taking sudafed for his congestion and eustachian tube dysfunction.     Pt is also requesting hepatitis testing. States that recent testing at [facility] for STD screening had questionable results from his  Hepatitis B testing; pt is requesting retesting to clarify. Pt does not recall what testing was done for Hepatitis B.                Problem List:    Patient Active Problem List   Diagnosis   . Verruca vulgaris   . Major depressive disorder   . Anxiety   . Acne   . High risk heterosexual behavior             Current Meds:    Current Outpatient Medications   Medication Sig Dispense Refill   . amoxicillin-clavulanate (Augmentin) 875-125 MG per tablet Take 1 tablet by mouth 2 (two) times daily for 7 days 14 tablet 0     No current facility-administered medications for this visit.          Allergies:    Allergies   Allergen Reactions   . Levonorgestrel-Ethinyl Estrad Tinitus             Past Surgical History:    History reviewed. No pertinent surgical history.        Family History:    History reviewed. No pertinent family history.  Social History:    Social History     Tobacco Use   . Smoking status: Current Some Day Smoker     Years: 3.00     Types: Cigarettes   . Smokeless tobacco: Former [employer]   . Vaping Use: Some days   Substance Use Topics   . Alcohol use: Not Currently     Alcohol/week: 8.0 standard drinks     Types: 8 Glasses of wine per week   . Drug use: Yes     Types: Marijuana           The following sections were reviewed this encounter by the provider:   Tobacco  Allergies  Meds  Problems  Med Hx  Surg Hx  Fam Hx             Vital Signs:    There were no vitals taken for this visit.         ROS:    ROS per HPI; all other systems reviewed and are negative          Physical Exam:    Physical Exam  Constitutional:       General: He is not in acute distress.     Appearance: Normal appearance. He is normal weight. He is not ill-appearing, toxic-appearing or diaphoretic.   HENT:      Head: Normocephalic.   Eyes:      General: No scleral icterus.     Conjunctiva/sclera: Conjunctivae normal.   Pulmonary:      Effort: Pulmonary effort is normal. No respiratory distress.   Neurological:       Mental Status: He is alert and oriented to person, place, and time.   Psychiatric:         Mood and Affect: Mood normal.         Behavior: Behavior normal.         Thought Content: Thought content normal.         Judgment: Judgment normal.              Assessment/ plan:    1. Subacute maxillary sinusitis  - amoxicillin-clavulanate (Augmentin) 875-125 MG per tablet; Take 1 tablet by mouth 2 (two) times daily for 7 days  Dispense: 14 tablet; Refill: 0  - continue nasal saline rinse    2. Nasal congestion    3. Seasonal allergic rhinitis due to pollen  - start flonase/ nasacort     4. Dysfunction of Eustachian tube, unspecified laterality    5. Screening examination for venereal disease  - Chlamydia & Gonorrhoeae NAA; Future  - POCT UA Clinitek AX (urine dipstick); Future  - POCT UA Clinitek AX (urine dipstick)  - Chlamydia & Gonorrhoeae NAA    6. Hepatitis B vaccination status unknown  - Hepatitis Panel, Acute; Future  - Hepatitis B (HBV) Surface Antigen w/ Reflex to Confirmation; Future  - Hepatitis B (HBV) Surface Antibody Quant; Future  - Comprehensive metabolic panel; Future  - Comprehensive metabolic panel  - Hepatitis B (HBV) Surface Antibody Quant  - Hepatitis B (HBV) Surface Antigen w/ Reflex to Confirmation  - Hepatitis Panel, Acute                Follow-up:    Return if symptoms worsen or fail to improve.   Return for [facility]        Reynold Bowen, MD

## 2021-01-27 LAB — COMPREHENSIVE METABOLIC PANEL
ALT: 20 IU/L (ref 0–44)
AST (SGOT): 16 IU/L (ref 0–40)
African American eGFR: 125 mL/min/{1.73_m2} (ref >59)
Albumin/Globulin Ratio: 2.1 (ref 1.2–2.2)
Albumin: 5.1 g/dL (ref 4.1–5.2)
Alkaline Phosphatase: 59 IU/L (ref 44–121)
BUN / Creatinine Ratio: 12 (ref 9–20)
BUN: 11 mg/dL (ref 6–20)
Bilirubin, Total: 0.6 mg/dL (ref 0.0–1.2)
CO2: 27 mmol/L (ref 20–29)
Calcium: 9.6 mg/dL (ref 8.7–10.2)
Chloride: 102 mmol/L (ref 96–106)
Creatinine: 0.95 mg/dL (ref 0.76–1.27)
Globulin, Total: 2.4 g/dL (ref 1.5–4.5)
Glucose: 65 mg/dL (ref 65–99)
Potassium: 4.2 mmol/L (ref 3.5–5.2)
Protein, Total: 7.5 g/dL (ref 6.0–8.5)
Sodium: 143 mmol/L (ref 134–144)
non-African American eGFR: 108 mL/min/{1.73_m2} (ref >59)

## 2021-01-27 LAB — HEPATITIS PANEL, ACUTE
HCV AB: <0.1 s/co ratio (ref 0.0–0.9)
Hep A IgM: NEGATIVE
Hepatitis B Core Ab, IgM: NEGATIVE
Hepatitis B Surface Antigen: NEGATIVE

## 2021-01-27 LAB — INTERPRETATION:

## 2021-01-27 LAB — HEPATITIS B SURFACE ANTIBODY: HEPATITIS B SURFACE ANTIBODY: 40.8 m[IU]/mL (ref >9.9)

## 2021-01-28 LAB — CHLAMYDIA GONORRHOEAE NAA
CHLAMYDIA TRACHOMATIS, NAA: NEGATIVE
Neisseria gonorrhoeae, NAA: NEGATIVE

## 2021-01-30 ENCOUNTER — Encounter (INDEPENDENT_AMBULATORY_CARE_PROVIDER_SITE_OTHER): Payer: Self-pay | Admitting: Family Medicine

## 2022-01-01 ENCOUNTER — Encounter (INDEPENDENT_AMBULATORY_CARE_PROVIDER_SITE_OTHER): Payer: Self-pay | Admitting: Family

## 2022-01-01 ENCOUNTER — Ambulatory Visit (INDEPENDENT_AMBULATORY_CARE_PROVIDER_SITE_OTHER): Payer: PRIVATE HEALTH INSURANCE | Admitting: Family

## 2022-01-01 VITALS — BP 119/77 | HR 66 | Temp 97.7°F | Ht 74.75 in | Wt 191.6 lb

## 2022-01-01 DIAGNOSIS — R399 Unspecified symptoms and signs involving the genitourinary system: Secondary | ICD-10-CM

## 2022-01-01 DIAGNOSIS — Z113 Encounter for screening for infections with a predominantly sexual mode of transmission: Secondary | ICD-10-CM

## 2022-01-01 DIAGNOSIS — Z7251 High risk heterosexual behavior: Secondary | ICD-10-CM

## 2022-01-01 LAB — POCT URINALYSIS AUTOMATED (IAH)
Bilirubin, UA POCT: NEGATIVE
Blood, UA POCT: NEGATIVE
Glucose, UA POCT: NEGATIVE
Ketones, UA POCT: NEGATIVE mg/dL
Nitrite, UA POCT: NEGATIVE
PH, UA POCT: 6.5 (ref 4.6–8)
Protein, UA POCT: NEGATIVE mg/dL
Specific Gravity, UA POCT: 1.02 mg/dL (ref 1.001–1.035)
Urine Leukocytes POCT: NEGATIVE
Urobilinogen, UA POCT: 0.2 mg/dL

## 2022-01-01 NOTE — Progress Notes (Signed)
Subjective:      Patient ID: Michael Henry is a 31 y.o. male.    Chief Complaint:  Chief Complaint   Patient presents with    Urinary Tract Infection Symptoms     HPI:  Pt presents with Urinary sxs x 3 days  Sxs: urethral burning, penile discharge, urinary frequency last night, abdominal discomfort, slight pain in low back  States this has occurred in the past  Recently traveled to [place] and states conditions were not the most sanitary  Has had unprotected sex in the past month  No fever, chills.  Often gets irritation around the tip of his penis in the winter.        Problem List:  Patient Active Problem List   Diagnosis    Verruca vulgaris    Major depressive disorder    Anxiety    Acne    High risk heterosexual behavior       Current Medications:  No current outpatient medications on file.     No current facility-administered medications for this visit.       Allergies:  Allergies   Allergen Reactions    Sudafed [Pseudoephedrine]      Prostate pain/discomfort       Past Medical History:  History reviewed. No pertinent past medical history.    Past Surgical History:  History reviewed. No pertinent surgical history.    Family History:  History reviewed. No pertinent family history.    Social History:  Social History     Socioeconomic History    Marital status: Single   Tobacco Use    Smoking status: Some Days     Years: 3.00     Types: Cigarettes    Smokeless tobacco: Former   Haematologist Use: Some days   Substance and Sexual Activity    Alcohol use: Not Currently     Alcohol/week: 8.0 standard drinks     Types: 8 Glasses of wine per week    Drug use: Yes     Types: Marijuana    Sexual activity: Not Currently       The following portions of the patient's history were reviewed and updated as appropriate: allergies, current medications, past family history, past medical history, past social history, past surgical history and problem list.    ROS:  Review of Systems  Per HPI    Vitals:  BP 119/77 (BP Site:  Right arm, Patient Position: Sitting, Cuff Size: Large)   Pulse 66   Temp 97.7 F (36.5 C) (Tympanic)   Ht 1.899 m (6' 2.75")   Wt 86.9 kg (191 lb 9.6 oz)   SpO2 97%   BMI 24.11 kg/m   Objective:   Physical Exam:  Physical Exam  Constitutional:       Appearance: Normal appearance. He is normal weight.   Pulmonary:      Effort: Pulmonary effort is normal.   Abdominal:      General: Abdomen is flat. Bowel sounds are normal.      Palpations: Abdomen is soft.      Tenderness: There is no right CVA tenderness or left CVA tenderness.   Genitourinary:     Comments: Mild erythema around urinary meatus, no discharge  Musculoskeletal:         General: Normal range of motion.   Skin:     General: Skin is warm and dry.   Neurological:      Mental Status: He is alert.  Assessment/   1. UTI symptoms  - Urine culture  - POCT UA Clinitek AX (urine dipstick)  - Urine - Chlamydia/Neisseria/Trich PCR    2. Screening examination for STD (sexually transmitted disease)  - HIV-1/2 Ag/Ab 4th Gen. w/ Reflex  - Syphilis Screen IgG and IgM  - Hepatitis C (HCV) antibody, Total  - Hepatitis B (HBV) Surface Antigen w/ Reflex to Confirmation    3. High risk heterosexual behavior        Plan:   Testing for UTI and STDs.  Can apply Vaseline or Aquaphor around tip of penis.    Levy Sjogren, NP

## 2022-01-02 LAB — SYPHILIS SCREEN IGG AND IGM: Syphilis Screen IgG and IgM: NONREACTIVE

## 2022-01-02 LAB — HIV-1/2 AG/AB 4TH GEN. W/ REFLEX: HIV Ag/Ab, 4th Generation: NONREACTIVE

## 2022-01-02 LAB — HEPATITIS B SURFACE ANTIGEN W/ REFLEX TO CONFIRMATION: Hepatitis B Surface Antigen: NONREACTIVE

## 2022-01-02 LAB — HEPATITIS C ANTIBODY: Hepatitis C, AB: NONREACTIVE

## 2022-01-03 LAB — URINE - CHLAMYDIA/NEISSERIA/TRICH PCR
Chlamydia DNA by PCR: NEGATIVE
Neisseria gonorrhoeae by PCR: NEGATIVE
Trichomonas vaginalis, DNA: NEGATIVE

## 2022-01-04 ENCOUNTER — Telehealth (INDEPENDENT_AMBULATORY_CARE_PROVIDER_SITE_OTHER): Payer: Self-pay

## 2022-01-04 NOTE — Telephone Encounter (Signed)
Called and spoke w/ pt.  Informed of results.  Pr verbalized thanks and understanding.

## 2022-01-04 NOTE — Telephone Encounter (Signed)
-----   Message from Levy Sjogren, NP sent at 01/03/2022  5:27 PM EST -----  Please inform Michael Henry that all of his tests came back negative and his urine culture did not show any bacterial infection.
